# Patient Record
Sex: Male | Born: 1955 | Race: White | Hispanic: No | Marital: Married | Smoking: Former smoker
Health system: Southern US, Community
[De-identification: ages and names within clinical notes are randomized; demographics above are authoritative.]

## PROBLEM LIST (undated history)

## (undated) DIAGNOSIS — J984 Other disorders of lung: Secondary | ICD-10-CM

## (undated) DIAGNOSIS — I10 Essential (primary) hypertension: Secondary | ICD-10-CM

## (undated) DIAGNOSIS — G43909 Migraine, unspecified, not intractable, without status migrainosus: Secondary | ICD-10-CM

---

## 2014-04-20 ENCOUNTER — Ambulatory Visit
Admit: 2014-04-20 | Disposition: A | Discharge status: Home / Self Care | Payer: Self-pay | Attending: Family Medicine | Admitting: Family Medicine

## 2014-04-28 ENCOUNTER — Ambulatory Visit: Admit: 2014-04-28 | Disposition: A | Discharge status: Home / Self Care | Payer: Self-pay | Admitting: Neurology

## 2014-04-28 ENCOUNTER — Observation Stay
Admit: 2014-04-28 | Disposition: A | Discharge status: Home / Self Care | Payer: Self-pay | Attending: Internal Medicine | Admitting: Internal Medicine

## 2014-04-28 DIAGNOSIS — I351 Nonrheumatic aortic (valve) insufficiency: Secondary | ICD-10-CM | POA: Diagnosis not present

## 2014-04-28 LAB — POTASSIUM: Potassium: 2.8 mmol/L — ABNORMAL LOW

## 2014-04-28 LAB — CBC WITH DIFFERENTIAL/PLATELET
BASOS PCT: 0.9 %
Basophil #: 0.1 10*3/uL (ref 0.0–0.1)
EOS PCT: 2 %
Eosinophil #: 0.1 10*3/uL (ref 0.0–0.7)
HCT: 44.5 % (ref 40.0–52.0)
HGB: 15.6 g/dL (ref 13.0–18.0)
LYMPHS PCT: 34 %
Lymphocyte #: 2.3 10*3/uL (ref 1.0–3.6)
MCH: 32 pg (ref 26.0–34.0)
MCHC: 35 g/dL (ref 32.0–36.0)
MCV: 91 fL (ref 80–100)
Monocyte #: 0.4 x10 3/mm (ref 0.2–1.0)
Monocyte %: 6.6 %
Neutrophil #: 3.8 10*3/uL (ref 1.4–6.5)
Neutrophil %: 56.5 %
Platelet: 250 10*3/uL (ref 150–440)
RBC: 4.87 10*6/uL (ref 4.40–5.90)
RDW: 12.5 % (ref 11.5–14.5)
WBC: 6.8 10*3/uL (ref 3.8–10.6)

## 2014-04-28 LAB — PROTIME-INR
INR: 0.9
Prothrombin Time: 12.8 secs

## 2014-04-28 LAB — COMPREHENSIVE METABOLIC PANEL
ALBUMIN: 4 g/dL
ANION GAP: 11 (ref 7–16)
AST: 24 U/L
Alkaline Phosphatase: 57 U/L
BUN: 17 mg/dL
Bilirubin,Total: 0.4 mg/dL
CALCIUM: 9.1 mg/dL
CHLORIDE: 99 mmol/L — AB
Co2: 27 mmol/L
Creatinine: 1.02 mg/dL
EGFR (Non-African Amer.): >60
Glucose: 109 mg/dL — ABNORMAL HIGH
POTASSIUM: 2.7 mmol/L — AB
SGPT (ALT): 23 U/L
Sodium: 137 mmol/L
Total Protein: 6.9 g/dL

## 2014-04-28 LAB — TROPONIN I
Troponin-I: <0.03 ng/mL
Troponin-I: <0.03 ng/mL

## 2014-04-28 LAB — APTT: ACTIVATED PTT: 25.9 s (ref 23.6–35.9)

## 2014-04-28 LAB — MAGNESIUM: MAGNESIUM: 1.9 mg/dL

## 2014-04-29 ENCOUNTER — Ambulatory Visit: Admit: 2014-04-29 | Disposition: A | Discharge status: Home / Self Care | Payer: Self-pay | Admitting: Neurology

## 2014-04-29 LAB — CBC WITH DIFFERENTIAL/PLATELET
Basophil #: 0 10*3/uL (ref 0.0–0.1)
Basophil %: 0.2 %
EOS ABS: 0 10*3/uL (ref 0.0–0.7)
EOS PCT: 0 %
HCT: 42.1 % (ref 40.0–52.0)
HGB: 14.7 g/dL (ref 13.0–18.0)
LYMPHS PCT: 7.8 %
Lymphocyte #: 0.8 10*3/uL — ABNORMAL LOW (ref 1.0–3.6)
MCH: 32 pg (ref 26.0–34.0)
MCHC: 34.9 g/dL (ref 32.0–36.0)
MCV: 92 fL (ref 80–100)
MONO ABS: 0.6 x10 3/mm (ref 0.2–1.0)
Monocyte %: 5.6 %
Neutrophil #: 8.6 10*3/uL — ABNORMAL HIGH (ref 1.4–6.5)
Neutrophil %: 86.4 %
Platelet: 242 10*3/uL (ref 150–440)
RBC: 4.6 10*6/uL (ref 4.40–5.90)
RDW: 12.7 % (ref 11.5–14.5)
WBC: 10 10*3/uL (ref 3.8–10.6)

## 2014-04-29 LAB — BASIC METABOLIC PANEL
ANION GAP: 6 — AB (ref 7–16)
BUN: 17 mg/dL
CREATININE: 0.92 mg/dL
Calcium, Total: 8.6 mg/dL — ABNORMAL LOW
Chloride: 106 mmol/L
Co2: 24 mmol/L
EGFR (Non-African Amer.): >60
GLUCOSE: 130 mg/dL — AB
Potassium: 4 mmol/L
Sodium: 136 mmol/L

## 2014-04-29 LAB — HEMOGLOBIN A1C: Hemoglobin A1C: 5.2 %

## 2014-05-22 NOTE — H&P (Signed)
PATIENT NAME:  Terrance Willis, Terrance Willis MR#:  [id number] DATE OF BIRTH:  [birth year]  DATE OF ADMISSION:  04/28/2014  CHIEF COMPLAINT:  Numbness.   HISTORY OF PRESENT ILLNESS:  This is a 59 year old male who presents to the ED tonight with chief complaint of recurring intermittent numbness. He states that the numbness occurs in his left face and runs down his left arm typically. It has been occurring for the past a little more than a week and it has been coming and going. It is also associated at times with blurred vision and some dizziness, although not room spinning dizziness, but it is sort of a sensation of maybe ataxia, loss of balance, and some other smaller neurological findings, feeling of being "off" or "drunk" with some slowed speech and some report of some transient dysarthria with these episodes. He also states that after the episode lasts, which is usually about 3-1/2 hours, his lungs will feel like they are recovering from being "asleep" and often times the left arm will feel cold. Tonight, he states that he came in to the ED because the episode was actually a lot faster in onset and it did not just run down his left arm, the numbness ran all the way down his left leg, and he also had some chest pressure, cold sweats, and diaphoresis with it tonight. He has not been able to identify any aggravating or alleviating factors. He also states that of note last August, he did have an episode, not like these episodes, but he did have an episode of symptom where he went blind for a few seconds while he was driving. He did not pay much attention to it at that time. He thought it was related to the heat, but thinking back on it now, he is wondering if it is not related to what is going on at this current time. In the ED, CT of the head was negative. Workup was largely negative. The patient was recently admitted at another facility for similar symptoms and had an MRI done. We are requesting that MRI at this time. We have  contacted neurology from the ED and they recommended bringing him in for transient ischemic attack workup. The hospitalists were called for admission.   PRIMARY CARE PHYSICIAN:  Kris Mouton. Gerilyn Pilgrim, PA-C   PAST MEDICAL HISTORY:  Hypertension, GERD, asthma, ascending aortic aneurysm.   CURRENT MEDICATIONS:  ProAir 2 puffs q. 6 p.r.n., Nexium 40 mg daily, lisinopril 10 mg daily, Advair 250/50 one puff b.i.d.   PAST SURGICAL HISTORY: Lung surgery for removal of benign lesion left bicep repair and head fracture repair at some point in the past.   ALLERGIES:  No known drug allergies.   FAMILY HISTORY:  CAD, stroke, cancer, kidney disease, aortic aneurysm.   SOCIAL HISTORY:  Ex-smoker, quit 20 years ago. Drinks occasionally at social events, very occasionally per patient. Denies illicit drug use.   REVIEW OF SYSTEMS: CONSTITUTIONAL:  No fever, fatigue, or weakness.  EYES:  Some intermittent blurred vision. No pain or redness.  EARS, NOSE, AND THROAT:  No ear pain, hearing loss, or difficulty swallowing.  RESPIRATORY:  No cough, dyspnea, or painful respiration.  CARDIOVASCULAR:  He did have some chest pressure tonight. No edema or palpitations.  GASTROINTESTINAL:  No nausea, vomiting, diarrhea, abdominal pain, or constipation.  GENITOURINARY:  No dysuria, hematuria, or frequency.  ENDOCRINE:  No nocturia, thyroid problems, or heat or cold intolerance.  HEMATOLOGIC AND LYMPHATIC:  No easy bruising, bleeding, or swollen glands.  INTEGUMENTARY:  No acne, rash, or lesion.  MUSCULOSKELETAL:  No acute arthritis, joint swelling, or gout.  NEUROLOGICAL:  Endorses numbness as per HPI. No true overt weakness, maybe some symptoms of some intermittent dysarthria. He does get a headache after these episodes. No report of epilepsy or prior seizures.  PSYCHIATRIC:  No anxiety, insomnia, or depression.   PHYSICAL EXAMINATION: VITAL SIGNS:  Blood pressure 128/85, pulse 65, temperature 97.8, respirations 19 with  96% oxygen saturation on room air.  GENERAL:  This is a well-nourished gentleman lying in bed in no acute distress.  HEENT:  Pupils are equal, round, and reactive to light and accommodation. Extraocular movements are intact. No scleral icterus. Moist mucosal membranes.  NECK:  Thyroid is not enlarged. Neck is supple. No masses. Nontender. No cervical adenopathy. No JVD.  RESPIRATORY:  Clear to auscultation bilaterally. No rales, rhonchi, or wheezing. No respiratory distress.  CARDIOVASCULAR:  Regular rate and rhythm. No murmurs, rubs, or gallops on exam. Good pedal pulses with no lower extremity edema.  ABDOMEN:  Soft, nontender, nondistended. Good bowel sounds.  MUSCULOSKELETAL:  Muscular strength is 5/5 throughout all 4 extremities with full spontaneous range of motion. No cyanosis or clubbing.  SKIN:  No rash or lesion. Skin is warm, dry, and intact.  LYMPHATIC:  No adenopathy.  NEUROLOGIC:  Cranial nerves are intact. Sensation is intact throughout. Strength is equal throughout. No dysarthria currently. No aphasia. No contractures.  PSYCHIATRIC:  Alert and oriented x 3. The patient is cooperative, not confused or agitated.   LABORATORY DATA:  White count is 6.8, hemoglobin 15.6, hematocrit 44.5, and platelets are 250,000. Sodium is 137, potassium 2.7, chloride 99, CO2 of 27, BUN 17, creatinine 1.02, glucose 109, calcium 9.1, total protein 6.9, albumin 4.0, total bilirubin 0.4, alkaline phosphatase 57, AST 24, and ALT is 23. Troponin is less than 0.03. INR is 0.9. CT of the head tonight:  No acute intracranial abnormalities, mild cerebral atrophy.   ASSESSMENT AND PLAN: 1.  Transient ischemic attack. The patient's symptoms seem most consistent with possible transient ischemic attacks. Neurology recommended admission and workup for the same. We will consult neurology. We will put him on telemetry, cycle cardiac enzymes, and get an echocardiogram. Likely, neurology will recommend further testing. We  will hold on ordering MRI for now until we get results of the MRI that he just had recently.  2.  Hypokalemia, unclear etiology for this. We will replace this here.  3.  Hypertension. This problem is chronic and stable. We will continue his home medications at this time, as his blood pressure is our goal currently.  4.  Gastroesophageal reflux disease. The patient is on Nexium at home. We will use Protonix here to control his gastroesophageal reflux disease symptoms. 5.  Asthma, chronic stable problem. We will continue his home inhalers.  6.  Deep vein thrombosis prophylaxis. Subcutaneous Lovenox.  CODE STATUS:  This patient is full code.   TIME SPENT ON THIS ADMISSION:  50 minutes.    ____________________________ Candace Cruiseavid F. Anne HahnWillis, MD dfw:nb D: 04/28/2014 04:50:10 ET T: 04/28/2014 05:13:09 ET JOB#: [id number]  cc: Candace Cruiseavid F. Anne HahnWillis, MD, <Dictator> Keanu Frickey Scotty CourtF Bettie Swavely MD ELECTRONICALLY SIGNED 04/28/2014 6:52

## 2014-05-22 NOTE — Discharge Summary (Signed)
PATIENT NAME:  Terrance LeydenMURRAY, Antrell MR#:  [id number] DATE OF BIRTH:  1955-04-14  DATE OF ADMISSION:  04/28/2014 DATE OF DISCHARGE:  04/29/2014  ADMITTING DIAGNOSIS:  Transient ischemic attack.   DISCHARGE DIAGNOSES:  1.  Complicated migraine with left-sided sensory symptoms, resolved.  2.  Hypovolemia. Hypokalemia. 3.  Relative hypomagnesemia likely due to proton pump inhibitor.  4.  Hyperglycemia with hemoglobin A1c 5.2, no diabetes.  5.  History of gastroesophageal reflux disease without esophagitis.  6.  Mild persistent asthma without complications.  7.  Essential hypertension.  8.  Ascending aortic aneurysm, stable.   DISCHARGE CONDITION:  Stable.   DISCHARGE MEDICATIONS: The patient is to continue lisinopril 10 mg p.o. daily, Advair Diskus 250/50 one puff twice daily, Nexium 40 mg daily, ProAir HFA 2 puffs 4 times daily, amitriptyline 50 mg 1 tablet at bedtime, to decrease to 25 mg if not tolerated, promethazine 25 mg every 6 hours as needed, magnesium oxide 400 mg p.o. daily.    HOME OXYGEN:  None.    DIET: 2  gram salt, low fat, low cholesterol, regular consistency.   ACTIVITY LIMITATIONS: As tolerated.    FOLLOWUP APPOINTMENT: With PA, Mr. Gerilyn PilgrimSykes in 2 days after discharge. Also, [facility] in 1 week after discharge.   CONSULTANTS:  Care management.  Social work.  Pauletta BrownsYuriy Zeylikman, MD, neurologist    RADIOLOGIC STUDIES:  CT scan of the head without contrast revealing no acute intracranial abnormalities, mild cerebral atrophy. A CTA of carotid arteries 04/28/2014 revealing no extracranial flow reducing lesion or dissection identified. MRV of brain 04/28/2014 showing negative MRV of the intracranial circulation.   HOSPITAL COURSE: The patient  is a 59 year old Caucasian male with past medical history significant for history of asthma, gastroesophageal reflux disease, also hypertension who presented to the hospital with complaints of tingling and numbness of  left side of his body associated with severe and worsening headaches. Please refer to Dr. Anne HahnWillis admission note on 04/28/2014.   On arrival to the Emergency Room, the patient's temperature was 97.8, pulse 65, respiration was 19, blood pressure 128/85, saturation was 96% on oxygen. Physical exam was unremarkable. The patient's lab data done on arrival to the hospital showed elevated glucose level of 109, potassium 2.7, otherwise, BMP was unremarkable. The patient's hemoglobin A1c was 5.2. Magnesium 1.9. The patient's liver enzymes were normal. Cardiac enzymes x 2 normal. CBC was within normal limits with no left shift. Coagulation panel was unremarkable. EKG with premature atrial complexes, right bundle branch block, inferior infarct age indeterminate, and no acute ST, T changes were noted.   The patient was admitted to the hospital with a diagnosis of TIA and neurology consultation was obtained. Neurologist felt that patient very likely has complicated migraine. He recommended to stop migraine with Decadron, phenergan  as well as magnesium IV, which really worked well for patient.  He was able to rest. He was initiated  by neurologist on amitriptyline for migraine prevention. With this therapy his condition improved and he was able to feel much better, his headache resolved, and as well as his sensory symptoms. On the day of discharge 04/29/2014 patient feels satisfactory, does not complain of any significant weakness or numbness and he is felt to be stable to be discharged home. His vitals were stable with temperature of 97.8, pulse was 61, respiration rate was 19, blood pressure 129/82, saturation was 96% on room air at rest. The patient  was advised to continue amitriptyline and follow up with  neurology in the next few weeks after discharge for further recommendations. He also underwent MRV of his brain, which showed no abnormalities.   In regards to hypokalemia, the patient's potassium was supplemented as  well as his magnesium as mentioned above and his potassium normalized. It was 4.0 on 04/29/2014.  Because of hyperglycemia  patient had a hemoglobin A1c checked and was found to be normal, no diabetes. For his chronic medical problems, he is to continue his outpatient medications. He is being discharged in stable condition with the above-mentioned medications and followup.   TIME SPENT: 40 minutes.    ____________________________ Katharina Caper, MD rv:AT D: 04/29/2014 19:00:18 ET T: 04/30/2014 00:40:23 ET JOB#: [id number]  cc: Katharina Caper, MD, <Dictator> [facility]  Kris Mouton. Gerilyn Pilgrim, PA-C  Katharina Caper MD ELECTRONICALLY SIGNED 05/01/2014 16:48

## 2014-05-22 NOTE — Consult Note (Signed)
PATIENT NAME:  Terrance Willis, Terrance Willis MR#:  [id number] DATE OF BIRTH:  Jun 13, 1955  DATE OF CONSULTATION:  04/28/2014  REFERRING PHYSICIAN:   CONSULTING PHYSICIAN:  Pauletta BrownsYuriy Jorita Bohanon, MD  REASON FOR CONSULTATION: Headache and  left-sided numbness.   HISTORY OF PRESENT ILLNESS: A 59 year old gentleman presenting with intermittent numbness on the left side of the face and the left arm. The patient states symptoms have been going on for the past week. The patient also states he currently has been having increased headaches for the past week, pressure-like, associated with nausea, vomiting, photophobia, phonophobia, relieved with rest. Numbness self-resolved. The patient is status post [facility]. Imaging was done. No acute intracranial abnormality was found, just small vessel disease. The patient was admitted this morning with severe headache, status post Phenergan, Decadron and magnesium. Currently the headache has resolved and numbness has resolved. The patient is currently back to his baseline.   PAST MEDICAL HISTORY: Hypertension, GERD, asthma, ascending aortic aneurysm.   HOME MEDICATIONS: Have been reviewed.   PAST MEDICAL HISTORY: Hypertension, GERD, asthma, ascending aortic aneurysm described above.   PAST SURGICAL HISTORY: Lung cancer.   FAMILY HISTORY: Coronary artery disease, history of stroke, and cancer.   REVIEW OF SYSTEMS: No shortness of breath. No chest pain. No abdominal pain. Positive for fatigue, positive for headache. Left-sided numbness that has resolved. No anxiety, no insomnia, no depression.    NEUROLOGIC EXAMINATION: The patient is awake, alert and oriented to time, place, location and the reason why he is in the hospital. Facial sensation intact. Facial motor is intact. Tongue is midline. Uvula elevates symmetrically. Motor: 5/5 bilateral upper and lower extremities. Sensation: Intact to light touch and temperature. Coordination: Finger-to-nose intact. Gait: Not  assessed.   IMPRESSION: A 59 year old  gentleman with chronic history of headaches, worse for the past week, described as pressure-like, diffuse, throbbing, positive photophobia, positive phonophobia, worse than previous headaches. Headaches are associated with left-sided numbness. I suspect these are complicated migraines. I am not sure why these are new onset in this patient, specifically at the age of 59.   PLAN: The patient is status post Decadron, Phenergan and magnesium, currently back to his baseline. Numbness has improved. No headache. I started the patient on Elavil 25 mg nightly. At the same time, MR venogram of head ordered to make sure there is no sinus thrombosis that could be contributing to these new headaches. He is status post MRI of brain at an outside facility. Physical therapy and occupational therapy. If MRV is negative, I suspect the patient could be discharged tomorrow. The patient was also told, and please address it in the discharge instructions, besides the Elavil, the patient should take daily magnesium over-the-counter and vitamin B complex over-the-counter, as well, for headache prevention.   Thank you. It was a pleasure seeing this patient. Please call with any questions.    ____________________________ Pauletta BrownsYuriy Anthem Frazer, MD yz:JT D: 04/28/2014 13:14:47 ET T: 04/28/2014 14:11:40 ET JOB#: [id number]  cc: Pauletta BrownsYuriy Cheree Fowles, MD, <Dictator> Pauletta BrownsYURIY Maleki Hippe MD ELECTRONICALLY SIGNED 05/17/2014 21:25

## 2014-08-17 ENCOUNTER — Emergency Department: Payer: 59

## 2014-08-17 ENCOUNTER — Emergency Department
Admission: EM | Admit: 2014-08-17 | Discharge: 2014-08-17 | Disposition: A | Discharge status: Home / Self Care | Payer: 59 | Attending: Emergency Medicine | Admitting: Emergency Medicine

## 2014-08-17 DIAGNOSIS — R51 Headache: Secondary | ICD-10-CM | POA: Insufficient documentation

## 2014-08-17 DIAGNOSIS — K529 Noninfective gastroenteritis and colitis, unspecified: Secondary | ICD-10-CM | POA: Insufficient documentation

## 2014-08-17 DIAGNOSIS — K922 Gastrointestinal hemorrhage, unspecified: Secondary | ICD-10-CM | POA: Insufficient documentation

## 2014-08-17 DIAGNOSIS — Z87891 Personal history of nicotine dependence: Secondary | ICD-10-CM | POA: Insufficient documentation

## 2014-08-17 DIAGNOSIS — R109 Unspecified abdominal pain: Secondary | ICD-10-CM | POA: Diagnosis present

## 2014-08-17 HISTORY — DX: Other disorders of lung: J98.4

## 2014-08-17 HISTORY — DX: Migraine, unspecified, not intractable, without status migrainosus: G43.909

## 2014-08-17 HISTORY — DX: Essential (primary) hypertension: I10

## 2014-08-17 LAB — COMPREHENSIVE METABOLIC PANEL
ALT: 16 U/L — ABNORMAL LOW (ref 17–63)
AST: 19 U/L (ref 15–41)
Albumin: 4 g/dL (ref 3.5–5.0)
Alkaline Phosphatase: 56 U/L (ref 38–126)
Anion gap: 12 (ref 5–15)
BUN: 12 mg/dL (ref 6–20)
CO2: 27 mmol/L (ref 22–32)
Calcium: 9.5 mg/dL (ref 8.9–10.3)
Chloride: 97 mmol/L — ABNORMAL LOW (ref 101–111)
Creatinine, Ser: 1.18 mg/dL (ref 0.61–1.24)
GFR calc Af Amer: >60 mL/min (ref >60)
GLUCOSE: 108 mg/dL — AB (ref 65–99)
POTASSIUM: 2.9 mmol/L — AB (ref 3.5–5.1)
Sodium: 136 mmol/L (ref 135–145)
Total Bilirubin: 0.9 mg/dL (ref 0.3–1.2)
Total Protein: 7.7 g/dL (ref 6.5–8.1)

## 2014-08-17 LAB — CBC WITH DIFFERENTIAL/PLATELET
BASOS ABS: 0 10*3/uL (ref 0–0.1)
Basophils Relative: 0 %
EOS ABS: 0.2 10*3/uL (ref 0–0.7)
EOS PCT: 1 %
HCT: 44.2 % (ref 40.0–52.0)
HEMOGLOBIN: 15.3 g/dL (ref 13.0–18.0)
Lymphocytes Relative: 13 %
Lymphs Abs: 1.6 10*3/uL (ref 1.0–3.6)
MCH: 31.3 pg (ref 26.0–34.0)
MCHC: 34.7 g/dL (ref 32.0–36.0)
MCV: 90.2 fL (ref 80.0–100.0)
MONOS PCT: 6 %
Monocytes Absolute: 0.8 10*3/uL (ref 0.2–1.0)
NEUTROS ABS: 9.7 10*3/uL — AB (ref 1.4–6.5)
Neutrophils Relative %: 80 %
PLATELETS: 239 10*3/uL (ref 150–440)
RBC: 4.9 MIL/uL (ref 4.40–5.90)
RDW: 12.3 % (ref 11.5–14.5)
WBC: 12.3 10*3/uL — AB (ref 3.8–10.6)

## 2014-08-17 LAB — LIPASE, BLOOD: LIPASE: 14 U/L — AB (ref 22–51)

## 2014-08-17 LAB — TYPE AND SCREEN
ABO/RH(D): B NEG
Antibody Screen: NEGATIVE

## 2014-08-17 LAB — ABO/RH: ABO/RH(D): B NEG

## 2014-08-17 LAB — TROPONIN I: Troponin I: <0.03 ng/mL (ref <0.031)

## 2014-08-17 MED ORDER — IOHEXOL 350 MG/ML SOLN
100.0000 mL | Freq: Once | INTRAVENOUS | Status: AC | PRN
  Administered 2014-08-17: 100 mL via INTRAVENOUS

## 2014-08-17 MED ORDER — ONDANSETRON HCL 4 MG/2ML IJ SOLN
4.0000 mg | Freq: Once | INTRAMUSCULAR | Status: AC
  Administered 2014-08-17: 4 mg via INTRAVENOUS
  Filled 2014-08-17: qty 2

## 2014-08-17 MED ORDER — IOHEXOL 240 MG/ML SOLN
25.0000 mL | Freq: Once | INTRAMUSCULAR | Status: AC | PRN
  Administered 2014-08-17: 25 mL via ORAL

## 2014-08-17 MED ORDER — ONDANSETRON 4 MG PO TBDP: 4.0000 mg | ORAL_TABLET | Freq: Three times a day (TID) | ORAL | Status: AC | PRN

## 2014-08-17 MED ORDER — OXYCODONE-ACETAMINOPHEN 5-325 MG PO TABS: 1.0000 | ORAL_TABLET | Freq: Four times a day (QID) | ORAL | Status: DC | PRN

## 2014-08-17 MED ORDER — CIPROFLOXACIN HCL 500 MG PO TABS: 500.0000 mg | ORAL_TABLET | Freq: Two times a day (BID) | ORAL | Status: AC

## 2014-08-17 MED ORDER — MORPHINE SULFATE 4 MG/ML IJ SOLN
4.0000 mg | Freq: Once | INTRAMUSCULAR | Status: AC
  Administered 2014-08-17: 4 mg via INTRAVENOUS
  Filled 2014-08-17: qty 1

## 2014-08-17 MED ORDER — OXYCODONE-ACETAMINOPHEN 5-325 MG PO TABS
1.0000 | ORAL_TABLET | Freq: Once | ORAL | Status: AC
  Administered 2014-08-17: 1 via ORAL
  Filled 2014-08-17: qty 1

## 2014-08-17 MED ORDER — METRONIDAZOLE 500 MG PO TABS: 500.0000 mg | ORAL_TABLET | Freq: Three times a day (TID) | ORAL | Status: AC

## 2014-08-17 MED ORDER — METRONIDAZOLE 500 MG PO TABS
500.0000 mg | ORAL_TABLET | Freq: Once | ORAL | Status: AC
  Administered 2014-08-17: 500 mg via ORAL
  Filled 2014-08-17: qty 1

## 2014-08-17 MED ORDER — CIPROFLOXACIN HCL 500 MG PO TABS
500.0000 mg | ORAL_TABLET | Freq: Once | ORAL | Status: AC
  Administered 2014-08-17: 500 mg via ORAL
  Filled 2014-08-17: qty 1

## 2014-08-17 MED ORDER — SODIUM CHLORIDE 0.9 % IV BOLUS (SEPSIS)
1000.0000 mL | Freq: Once | INTRAVENOUS | Status: AC
  Administered 2014-08-17: 1000 mL via INTRAVENOUS

## 2014-08-17 NOTE — ED Notes (Addendum)
Pt states Monday he had a migraine with violent vomiting and felt like something tore in his abdominal and had some blood in emesis , had has bloody stools since Monday night ..states he was feeling better until today when he attempted to eat oatmeal and began having abdominal pain with periods of diaphoresis.Marland Kitchen

## 2014-08-17 NOTE — ED Notes (Signed)
Patient transported to CT 

## 2014-08-17 NOTE — ED Notes (Signed)
Patient assessment by Dr. Lenard Lance at same time as nursing assessment, no change in description of events from triage note.

## 2014-08-17 NOTE — Discharge Instructions (Signed)
Please take your entire course of antibiotics, as written. Please take your pain medication and nausea medication as needed, as prescribed. Follow-up with your primary care doctor in the next several days to discuss further management, and also to discuss repeat CT scan in 1 year to evaluate for pulmonary nodules. Return to the emergency department for any fever, acute worsening of abdominal pain, or nausea and vomiting if you are unable to keep down her antibiotics.    Colitis Colitis is inflammation of the colon. Colitis can be a short-term or long-standing (chronic) illness. Crohn's disease and ulcerative colitis are 2 types of colitis which are chronic. They usually require lifelong treatment. CAUSES  There are many different causes of colitis, including:  Viruses.  Germs (bacteria).  Medicine reactions. SYMPTOMS   Diarrhea.  Intestinal bleeding.  Pain.  Fever.  Throwing up (vomiting).  Tiredness (fatigue).  Weight loss.  Bowel blockage. DIAGNOSIS  The diagnosis of colitis is based on examination and stool or blood tests. X-rays, CT scan, and colonoscopy may also be needed. TREATMENT  Treatment may include:  Fluids given through the vein (intravenously).  Bowel rest (nothing to eat or drink for a period of time).  Medicine for pain and diarrhea.  Medicines (antibiotics) that kill germs.  Cortisone medicines.  Surgery. HOME CARE INSTRUCTIONS   Get plenty of rest.  Drink enough water and fluids to keep your urine clear or pale yellow.  Eat a well-balanced diet.  Call your caregiver for follow-up as recommended. SEEK IMMEDIATE MEDICAL CARE IF:   You develop chills.  You have an oral temperature above 102 F (38.9 C), not controlled by medicine.  You have extreme weakness, fainting, or dehydration.  You have repeated vomiting.  You develop severe belly (abdominal) pain or are passing bloody or tarry stools. MAKE SURE YOU:   Understand these  instructions.  Will watch your condition.  Will get help right away if you are not doing well or get worse. Document Released: 02/15/2004 Document Revised: 04/01/2011 Document Reviewed: 05/12/2009 [facility] Patient Information 2015 [place], [place]. This information is not intended to replace advice given to you by your health care provider. Make sure you discuss any questions you have with your health care provider.

## 2014-08-17 NOTE — ED Provider Notes (Signed)
[facility] Emergency Department Provider Note  Time seen: 9:47 AM  I have reviewed the triage vital signs and the nursing notes.   HISTORY  Chief Complaint Abdominal Pain    HPI Terrance Willis is a 59 y.o. male with a past medical history of migraines who presents the emergency department with left-sided abdominal pain, and bloody stool. According to the patient he developed a severe migraine on Monday. The patient states he had significant nausea and vomiting at that time. Starting Monday night he began with diarrhea which she states was bright red blood. He states he continued to have bright red blood on Tuesday. This morning he awoke with left-sided abdominal pain. He states he became nauseated and diaphoretic after eating oatmeal. This is the first thing the patient has eaten in 2 days. He is also now developing a migraine as well. Describes his abdominal pain is moderate, but currently a 3/10. Describes it as a dull aching pain. Describes his headache as moderate, mostly left-sided.     Past Medical History  Diagnosis Date  . Migraine     There are no active problems to display for this patient.   History reviewed. No pertinent past surgical history.  No current outpatient prescriptions on file.  Allergies Mushroom extract complex  No family history on file.  Social History History  Substance Use Topics  . Smoking status: Former Games developer  . Smokeless tobacco: Never Used  . Alcohol Use: Yes    Review of Systems Constitutional: Negative for fever. Cardiovascular: Negative for chest pain. Respiratory: Negative for shortness of breath. Gastrointestinal: Moderate left-sided abdominal pain. Positive for nausea and vomiting. Positive for bloody stool. Patient states last bloody stool was last night. Genitourinary: Negative for dysuria Neurological: Negative for headache 10-point ROS otherwise  negative.  ____________________________________________   PHYSICAL EXAM:  VITAL SIGNS: ED Triage Vitals  Enc Vitals Group     BP 08/17/14 0919 113/89 mmHg     Pulse Rate 08/17/14 0919 104     Resp 08/17/14 0919 16     Temp 08/17/14 0919 98 F (36.7 C)     Temp Source 08/17/14 0919 Oral     SpO2 08/17/14 0919 96 %     Weight 08/17/14 0919 185 lb (83.915 kg)     Height 08/17/14 0919 5\' 10"  (1.778 m)     Head Cir --      Peak Flow --      Pain Score 08/17/14 0920 1     Pain Loc --      Pain Edu? --      Excl. in GC? --     Constitutional: Alert and oriented. Well appearing and in no distress. Eyes: Normal exam ENT   Mouth/Throat: Mucous membranes are moist. Cardiovascular: Normal rate, regular rhythm. No murmurs, rubs, or gallops. Respiratory: Normal respiratory effort without tachypnea nor retractions. Breath sounds are clear and equal bilaterally. No wheezes/rales/rhonchi. Gastrointestinal: Moderate epigastric and left abdominal tenderness palpation. No rebound or guarding. No distention. Mild left CVA tenderness palpation. Rectal exam is nontender, gross blood present, guaiac positive. Musculoskeletal: Nontender with normal range of motion in all extremities.  Neurologic:  Normal speech and language. No gross focal neurologic deficits  Skin:  Skin is warm, dry and intact.  Psychiatric: Mood and affect are normal. Speech and behavior are normal.   ____________________________________________   RADIOLOGY  CT consistent with colitis.  ____________________________________________   INITIAL IMPRESSION / ASSESSMENT AND PLAN / ED COURSE  Pertinent  labs & imaging results that were available during my care of the patient were reviewed by me and considered in my medical decision making (see chart for details).  Patient with moderate left-sided abdominal tenderness palpation. Also with moderate epigastric tenderness palpation. Rectal exam shows one small hemorrhoid,  nontender, but grossly positive for blood which is strongly guaiac positive. We will proceed with a CT scan to help further evaluate the patient is abdominal pain. We will treat his pain, IV hydrate, and closely monitor in the emergency department. Given the patient's weakness with diaphoresis this morning with recent GI bleeding we will also send a type and screen and begin normal saline bolus.  CT scan consistent with colitis, white count of 12.5. We will treat the patient with Flagyl and ciprofloxacin as well as pain medication and nausea medication. Patient is agreeable to this plan. I also discussed the incidental findings of an appendicolith and pulmonary nodule with the patient, he will bring the CT report to his primary care doctor to arrange follow-up. Patient does have a smoking history but quit 20+ years ago.  ____________________________________________   FINAL CLINICAL IMPRESSION(S) / ED DIAGNOSES  Lower GI bleed Headache Abdominal pain Colitis   Minna Antis, MD 08/17/14 1342

## 2016-07-08 IMAGING — CR CERVICAL SPINE - COMPLETE 4+ VIEW
1 series · 6 of 6 positions shown · non-contrast
Comparison: None.

CLINICAL DATA: Fell down stairs last night

EXAM:
CERVICAL SPINE  4+ VIEWS

[Series 1: dxr cervical spine complete · 0.14mm/px · 6 of 6 slices shown]
[im 1/6]
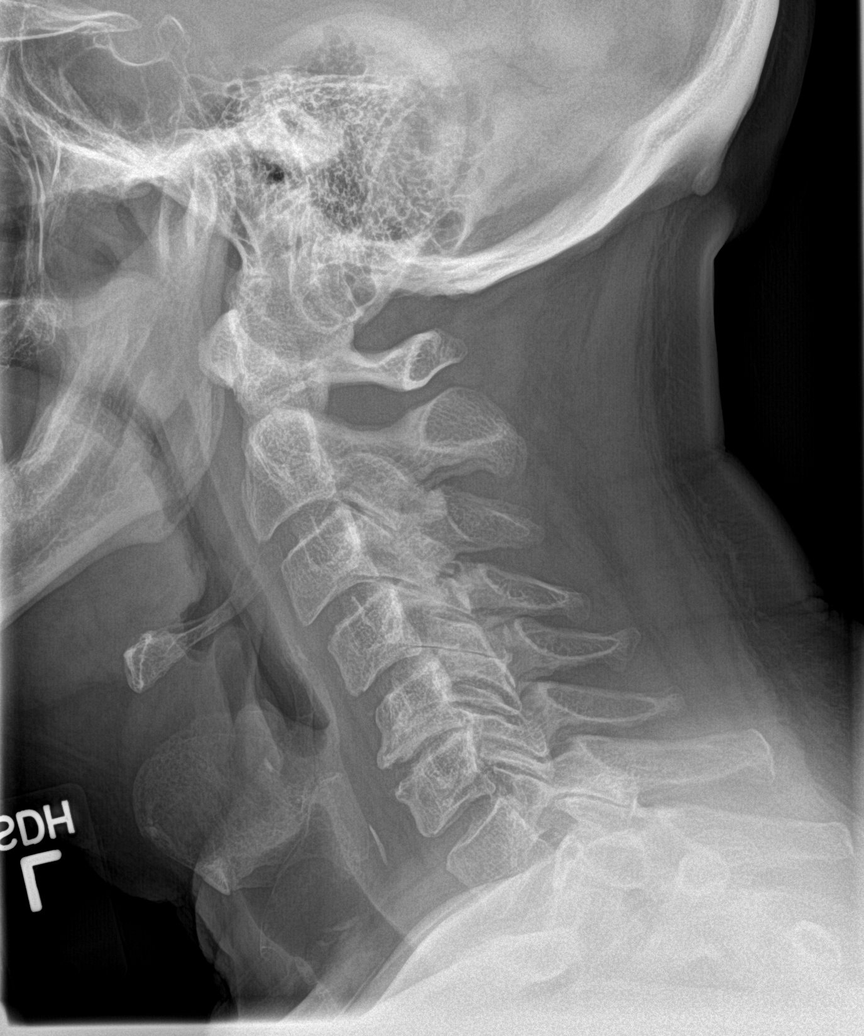
[im 2/6]
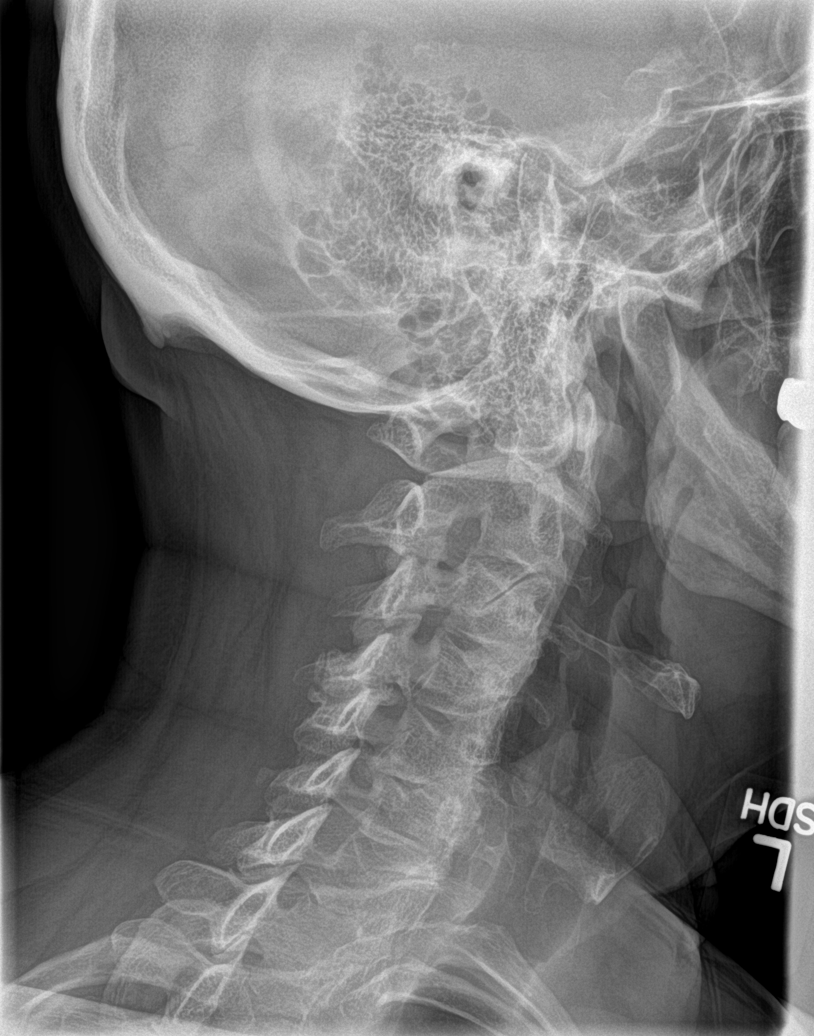
[im 3/6]
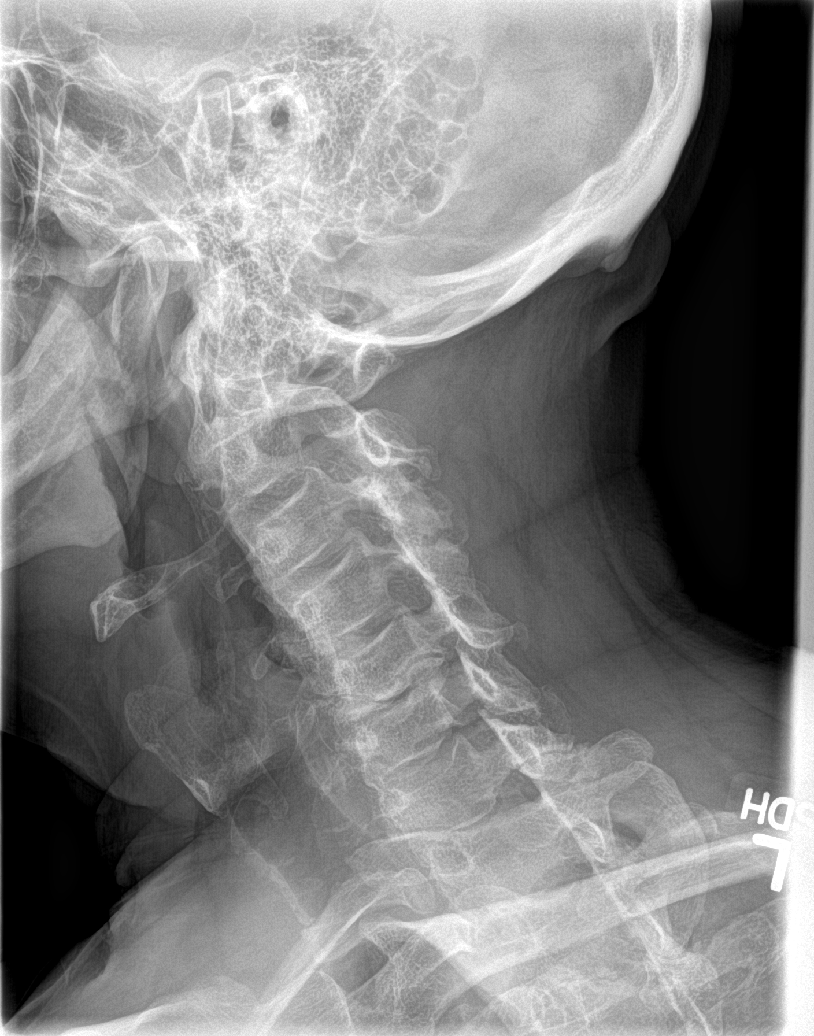
[im 4/6]
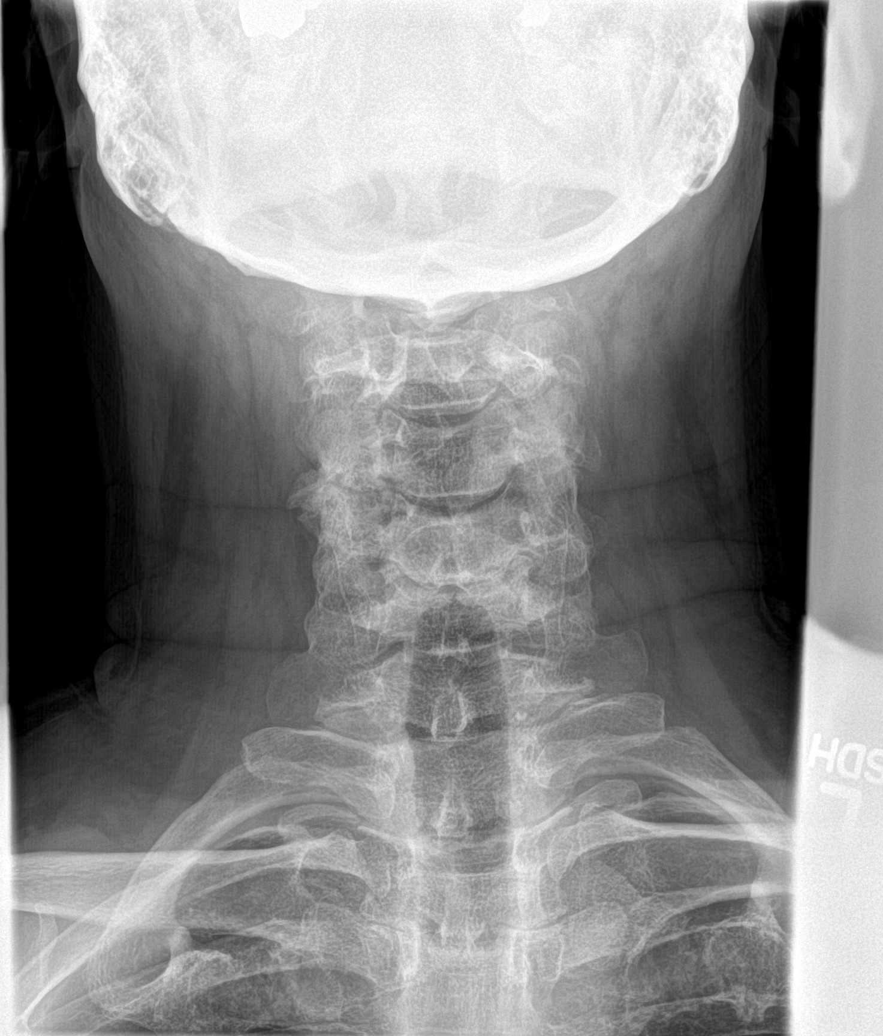
[im 5/6]
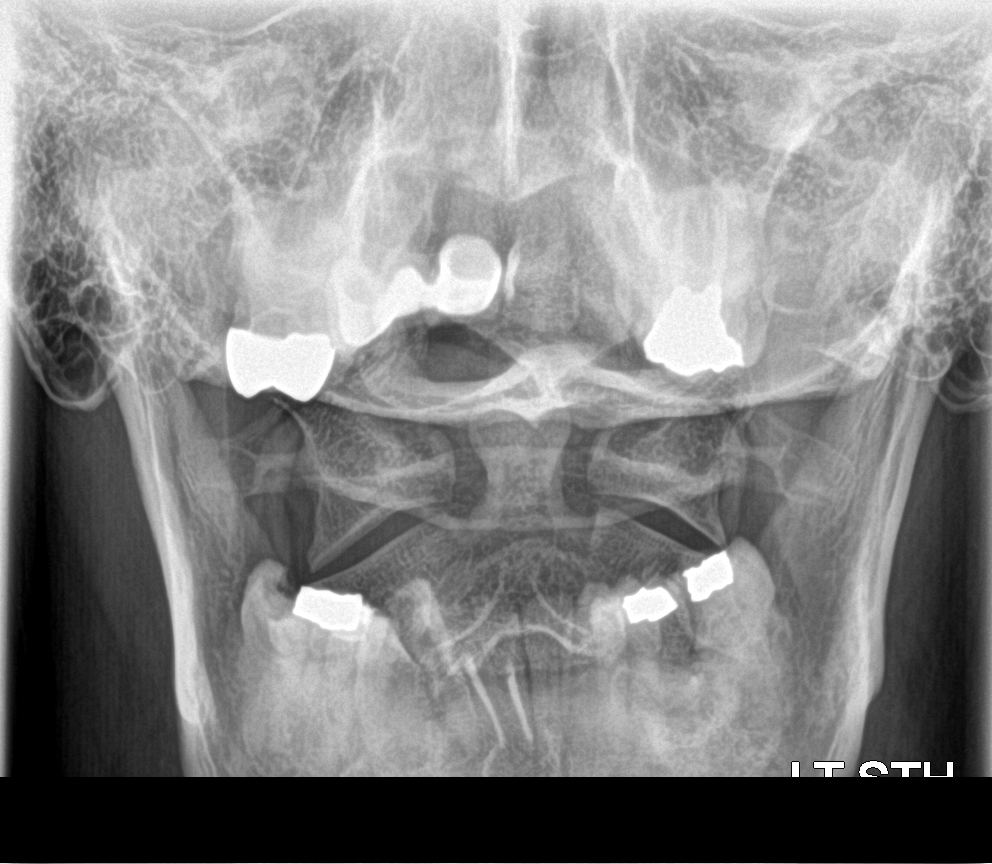
[im 6/6]
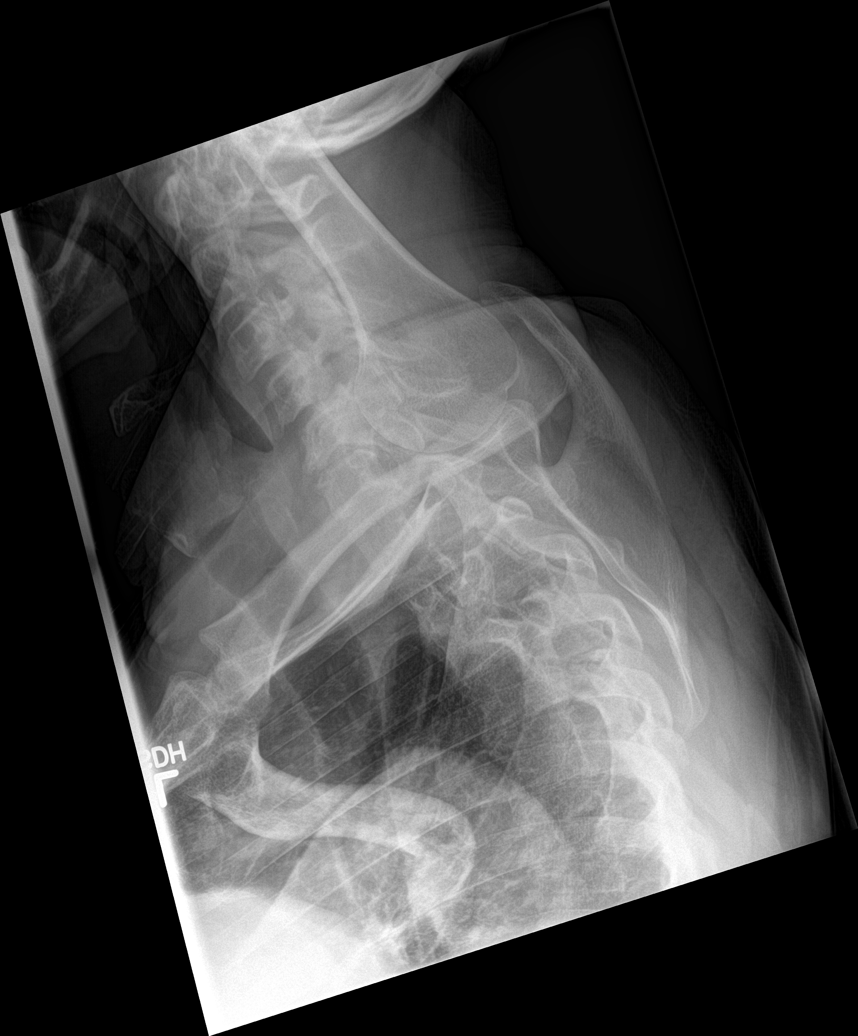

[6 of 6 positions shown; findings below may reference images not displayed]

FINDINGS: The cervical vertebrae are slightly straightened in alignment. There
is degenerative disc disease at C5-6 where there is loss of disc
space and sclerosis with spurring. The remainder of intervertebral
disc spaces appear normal. No prevertebral soft tissue swelling is
seen. On oblique views, there is moderate foraminal narrowing
bilaterally at C5-6. The odontoid process is intact. The lung apices
are clear.
IMPRESSION: Slightly straightened alignment with degenerative disc disease at
C5-6. Moderate bilateral foraminal narrowing at this level.

## 2020-04-18 ENCOUNTER — Encounter: Payer: Self-pay | Admitting: Neurology

## 2020-04-18 ENCOUNTER — Ambulatory Visit: Payer: Managed Care, Other (non HMO) | Admitting: Neurology

## 2020-04-18 VITALS — BP 134/82 | HR 60 | Ht 70.0 in | Wt 170.0 lb

## 2020-04-18 DIAGNOSIS — G2581 Restless legs syndrome: Secondary | ICD-10-CM | POA: Diagnosis not present

## 2020-04-18 NOTE — Progress Notes (Signed)
Subjective:    Patient ID: Terrance Willis is a 65 y.o. male.  HPI     Terrance Foley, MD, PhD [facility] [address], [address] [address] [place], [place] [address]  Dear Dr. Casper Harrison,   I saw your patient, Terrance Willis, upon your kind request, in my Neurologic clinic today for initial consultation of his restlessness.  The patient is unaccompanied today.  As you know, Terrance Willis is a 65 year old right-handed gentleman with an underlying medical history of hypertension, chronic lung disease, migraine headaches, aortic aneurysm, hyperlipidemia, reflux disease, osteoarthritis, and anxiety, who reports symptoms of restless leg syndrome.  He reports that he was recently diagnosed with fibromyalgia as well.  He reports that he has intermittent pain which affects different parts of his body.  When he is in more severe pain he has twitching in his fingertips and feet.  He has had some nighttime restlessness in his legs and movements at night in his sleep but these have improved since he has been on Lyrica.  His father had restless leg syndrome.  Patient reports no family history of Parkinson's disease.  He has an intermittent hand tremor.  He has had some numbness and tingling which is intermittent in different parts of his hands and feet.  He has not had any permanent numbness or weakness or one-sided symptoms such as sudden onset of one-sided weakness or numbness, droopy face or slurring of speech.  I reviewed the office note from 11/29/2019, as well as 02/14/2020.  He was taken off of Cymbalta for fibromyalgia and started on Lyrica at the time.  He reported pain and referral to a pain specialist was discussed.  He saw Maurie Boettcher, NP at the time. He had some blood work through your office a few months ago and was started on Lipitor.  Since he has been off of Cymbalta and amitriptyline and on the Lyrica, he reports feeling better.  He tries to hydrate with water.  He estimates  that he could do a little better with his water intake.  He limits his caffeine to 1 cup of coffee per day.  He drinks alcohol occasionally.  He quit smoking some 30 years ago.  His Past Medical History Is Significant For: Past Medical History:  Diagnosis Date  . Hypertension   . Migraine   . Restrictive airway disease 20 yrs ago    His Past Surgical History Is Significant For: History reviewed. No pertinent surgical history.  His Family History Is Significant For: History reviewed. No pertinent family history.  His Social History Is Significant For: Social History   Socioeconomic History  . Marital status: Married    Spouse name: Not on file  . Number of children: Not on file  . Years of education: Not on file  . Highest education level: Not on file  Occupational History  . Not on file  Tobacco Use  . Smoking status: Former Games developer  . Smokeless tobacco: Never Used  Substance and Sexual Activity  . Alcohol use: Yes  . Drug use: No  . Sexual activity: Yes  Other Topics Concern  . Not on file  Social History Narrative  . Not on file   Social Determinants of Health   Financial Resource Strain: Not on file  Food Insecurity: Not on file  Transportation Needs: Not on file  Physical Activity: Not on file  Stress: Not on file  Social Connections: Not on file    His Allergies Are:  Allergies  Allergen Reactions  . Mushroom Extract Complex Nausea And Vomiting  :   His Current Medications Are:  Outpatient Encounter Medications as of 04/18/2020  Medication Sig  . albuterol (PROVENTIL HFA;VENTOLIN HFA) 108 (90 BASE) MCG/ACT inhaler Inhale 1 puff into the lungs as needed for wheezing or shortness of breath.  Marland Kitchen aspirin-acetaminophen-caffeine (EXCEDRIN MIGRAINE) 250-250-65 MG tablet Take by mouth every 6 (six) hours as needed for headache.  Marland Kitchen atorvastatin (LIPITOR) 40 MG tablet Take 40 mg by mouth daily.  . B Complex Vitamins (VITAMIN B COMPLEX PO) Take by mouth.  .  co-enzyme Q-10 30 MG capsule Take 30 mg by mouth 3 (three) times daily.  Marland Kitchen esomeprazole (NEXIUM) 20 MG capsule Take 20 mg by mouth daily at 12 noon.  . Fluticasone-Salmeterol (ADVAIR) 250-50 MCG/DOSE AEPB Inhale 1 puff into the lungs 2 (two) times daily.  . Ginkgo Biloba (GINKOBA PO) Take by mouth.  . hydrochlorothiazide (HYDRODIURIL) 50 MG tablet Take 50 mg by mouth daily.  Marland Kitchen lisinopril (PRINIVIL,ZESTRIL) 20 MG tablet Take 20 mg by mouth daily.  . Magnesium 500 MG CAPS Take 250 mg by mouth daily.  . metoprolol succinate (TOPROL-XL) 25 MG 24 hr tablet Take 25 mg by mouth daily.  . pregabalin (LYRICA) 50 MG capsule Take 50 mg by mouth 2 (two) times daily as needed.  . TURMERIC PO Take by mouth.  Marland Kitchen VITAMIN D PO Take by mouth.  . ondansetron (ZOFRAN ODT) 4 MG disintegrating tablet Take 1 tablet (4 mg total) by mouth every 8 (eight) hours as needed for nausea or vomiting. (Patient not taking: Reported on 04/18/2020)  . [DISCONTINUED] amitriptyline (ELAVIL) 50 MG tablet Take 50 mg by mouth daily. (Patient not taking: Reported on 04/18/2020)  . [DISCONTINUED] oxyCODONE-acetaminophen (ROXICET) 5-325 MG per tablet Take 1 tablet by mouth every 6 (six) hours as needed.  . [DISCONTINUED] promethazine (PHENERGAN) 25 MG tablet Take 25 mg by mouth as needed.   No facility-administered encounter medications on file as of 04/18/2020.  :   Review of Systems:  Out of a complete 14 point review of systems, all are reviewed and negative with the exception of these symptoms as listed below:  Review of Systems  Neurological:       Here for consult on worsening numbness throughout the body. Hx of migraines as well, pt has light sensitivity. He does reports his PCP has mentioned that he might have fibromyalgia. Reports when his pain is at it's worst he will have twitching in his hands. He reports also he will not sleep for several days when pain severe.     Objective:  Neurological Exam  Physical Exam Physical  Examination:   Vitals:   04/18/20 1424  BP: 134/82  Pulse: 60    General Examination: The patient is a very pleasant 65 y.o. male in no acute distress. He appears well-developed and well-nourished and well groomed.   HEENT: Normocephalic, atraumatic, pupils are equal, round and reactive to light. Extraocular tracking is good without limitation to gaze excursion or nystagmus noted. Normal smooth pursuit is noted. Hearing is grossly intact.  Face is symmetric with normal facial animation and normal facial sensation.  Speech is clear with no dysarthria noted. There is no hypophonia. There is no lip, neck/head, jaw or voice tremor. Neck is supple with full range of passive and active motion. There are no carotid bruits on auscultation. Oropharynx exam reveals: mild mouth dryness, adequate dental hygiene.  Tongue protrudes centrally and palate elevates symmetrically.  Chest: Clear to auscultation without wheezing, rhonchi or crackles noted.  Heart: S1+S2+0, regular and normal without murmurs, rubs or gallops noted.   Abdomen: Soft, non-tender and non-distended with normal bowel sounds appreciated on auscultation.  Extremities: There is no pitting edema in the distal lower extremities bilaterally. Pedal pulses are intact.  Skin: Warm and dry without trophic changes noted.  Musculoskeletal: exam reveals no obvious joint deformities, tenderness or joint swelling or erythema.   Neurologically:  Mental status: The patient is awake, alert and oriented in all 4 spheres. His immediate and remote memory, attention, language skills and fund of knowledge are appropriate. There is no evidence of aphasia, agnosia, apraxia or anomia. Speech is clear with normal prosody and enunciation. Thought process is linear. Mood is normal and affect is normal.  Cranial nerves II - XII are as described above under HEENT exam. In addition: shoulder shrug is normal with equal shoulder height noted. Motor exam: Normal  bulk, strength and tone is noted. There is no drift, resting tremor or rebound.  Very slight postural tremor in both hands, intermittent.   Romberg is negative. Reflexes are 2+ throughout. Babinski: Toes are flexor bilaterally. Fine motor skills and coordination: intact with normal finger taps, normal hand movements, normal rapid alternating patting, normal foot taps and normal foot agility.  Cerebellar testing: No dysmetria or intention tremor on finger to nose testing. Heel to shin is unremarkable bilaterally. There is no truncal or gait ataxia.  Sensory exam: intact to light touch, pinprick, vibration, temperature sense with at times patchy decrease in pinprick and temperature sense, preserved vibration sense throughout, no sock-like or glovelike distribution, no dermatomal pattern.   Gait, station and balance: He stands easily. No veering to one side is noted. No leaning to one side is noted. Posture is age-appropriate and stance is narrow based. Gait shows normal stride length and normal pace. No problems turning are noted. Tandem walk is unremarkable.    Assessment and Plan:   In summary, Ketih Goodie is a very pleasant 65 y.o.-year old male with an underlying medical history of hypertension, chronic lung disease, migraine headaches, aortic aneurysm, hyperlipidemia, reflux disease, osteoarthritis, and anxiety, who presents for evaluation of his restlessness, he reports a history of restless leg syndrome.  Exam is benign.  He has no evidence of abnormal involuntary movements, has a slight hand postural tremor intermittently.  History and examination does not suggest any significant underlying movement disorder, symptoms of restless legs have improved since he started Lyrica.  He also reports chronic pain and a diagnosis of fibromyalgia.  His pain has improved with Lyrica as well.  He is largely reassured today.  No evidence of or telltale findings supportive of neuropathy.  He is advised to continue  follow-up through your office on a scheduled basis.  We will do some blood work today to look for treatable causes for restless leg syndrome including thyroid dysfunction, iron deficiency.  We will call him with his test results, so long as his results are benign, he can follow-up in this clinic on an as-needed basis.  I answered all his questions.  He was in agreement with the plan. Thank you very much for allowing me to participate in the care of this nice patient. If I can be of any further assistance to you please do not hesitate to call me at [phone].  Sincerely,   Terrance Foley, MD, PhD

## 2020-04-18 NOTE — Patient Instructions (Signed)
It was nice to meet you today.  I am glad to hear that your restless leg symptoms have improved. Your history and exam do not suggest any nerve damage or what we call neuropathy.  Certain conditions can exacerbate restless leg symptoms including certain antidepressant such as Cymbalta and amitriptyline.  You are currently not on any of these medications.  We will do some blood work for completion and call you with the results.  In particular, we will check for iron deficiency, anemia, thyroid dysfunction and vitamin B12 deficiency.  So long as your blood test results are benign we can see you in follow-up in this clinic on an as-needed basis, I do not see any signs of parkinsonism or underlying movement disorder.

## 2020-04-19 LAB — CBC WITH DIFFERENTIAL/PLATELET
Basophils Absolute: 0.1 10*3/uL (ref 0.0–0.2)
Basos: 2 %
EOS (ABSOLUTE): 0.3 10*3/uL (ref 0.0–0.4)
Eos: 5 %
Hematocrit: 43.9 % (ref 37.5–51.0)
Hemoglobin: 15 g/dL (ref 13.0–17.7)
Immature Grans (Abs): 0 10*3/uL (ref 0.0–0.1)
Immature Granulocytes: 0 %
Lymphocytes Absolute: 1.1 10*3/uL (ref 0.7–3.1)
Lymphs: 22 %
MCH: 33 pg (ref 26.6–33.0)
MCHC: 34.2 g/dL (ref 31.5–35.7)
MCV: 97 fL (ref 79–97)
Monocytes Absolute: 0.4 10*3/uL (ref 0.1–0.9)
Monocytes: 8 %
Neutrophils Absolute: 3.4 10*3/uL (ref 1.4–7.0)
Neutrophils: 63 %
Platelets: 262 10*3/uL (ref 150–450)
RBC: 4.55 x10E6/uL (ref 4.14–5.80)
RDW: 12.1 % (ref 11.6–15.4)
WBC: 5.3 10*3/uL (ref 3.4–10.8)

## 2020-04-19 LAB — IRON AND TIBC
Iron Saturation: 59 % — ABNORMAL HIGH (ref 15–55)
Iron: 166 ug/dL (ref 38–169)
Total Iron Binding Capacity: 280 ug/dL (ref 250–450)
UIBC: 114 ug/dL (ref 111–343)

## 2020-04-19 LAB — SEDIMENTATION RATE: Sed Rate: 2 mm/hr (ref 0–30)

## 2020-04-19 LAB — TSH: TSH: 0.583 u[IU]/mL (ref 0.450–4.500)

## 2020-04-19 LAB — VITAMIN D 25 HYDROXY (VIT D DEFICIENCY, FRACTURES): Vit D, 25-Hydroxy: 50.8 ng/mL (ref 30.0–100.0)

## 2020-04-19 LAB — HGB A1C W/O EAG: Hgb A1c MFr Bld: 5 % (ref 4.8–5.6)

## 2020-04-19 LAB — B12 AND FOLATE PANEL
Folate: 6.8 ng/mL (ref >3.0)
Vitamin B-12: 535 pg/mL (ref 232–1245)

## 2020-04-19 LAB — FERRITIN: Ferritin: 104 ng/mL (ref 30–400)

## 2020-04-19 LAB — ANA W/REFLEX: Anti Nuclear Antibody (ANA): NEGATIVE

## 2020-04-19 NOTE — Progress Notes (Signed)
Please call patient and advise him that his labs were benign.  As discussed, he can follow-up with his primary care.

## 2020-04-20 ENCOUNTER — Telehealth: Payer: Self-pay

## 2020-04-20 NOTE — Telephone Encounter (Signed)
Pt advised of results via vm. Advised to call back if he had any questions/concerns.

## 2020-04-20 NOTE — Telephone Encounter (Signed)
-----   Message from Huston Foley, MD sent at 04/19/2020  5:45 PM EDT ----- Please call patient and advise him that his labs were benign.  As discussed, he can follow-up with his primary care.

## 2023-07-15 ENCOUNTER — Ambulatory Visit

## 2023-07-15 VITALS — BP 106/52 | HR 61 | Ht 70.0 in | Wt 174.2 lb

## 2023-07-15 DIAGNOSIS — I7121 Aneurysm of the ascending aorta, without rupture: Secondary | ICD-10-CM | POA: Diagnosis not present

## 2023-07-15 DIAGNOSIS — I10 Essential (primary) hypertension: Secondary | ICD-10-CM | POA: Insufficient documentation

## 2023-07-15 DIAGNOSIS — E782 Mixed hyperlipidemia: Secondary | ICD-10-CM | POA: Diagnosis not present

## 2023-07-15 DIAGNOSIS — E785 Hyperlipidemia, unspecified: Secondary | ICD-10-CM | POA: Insufficient documentation

## 2023-07-15 NOTE — Assessment & Plan Note (Addendum)
 Abnormal lipid panel recently 06/11/2023 with total cholesterol 278, HDL 49, LDL 205, triglycerides 119.  Atorvastatin 40 mg once daily recently started by PCP. Continue the same. Recommended dietary and lifestyle modification.  Reassess lipid panel tentatively at 3 months, currently has been planned through his PCP. Will review fasting lipid panel at his subsequent follow-up visit in 4 months and titrate up medications as needed.  Target LDL levels below 100 mg/dL.

## 2023-07-15 NOTE — Patient Instructions (Signed)
 Medication Instructions:  Your physician recommends that you continue on your current medications as directed. Please refer to the Current Medication list given to you today.  *If you need a refill on your cardiac medications before your next appointment, please call your pharmacy*  Lab Work: None If you have labs (blood work) drawn today and your tests are completely normal, you will receive your results only by: MyChart Message (if you have MyChart) OR A paper copy in the mail If you have any lab test that is abnormal or we need to change your treatment, we will call you to review the results.  Testing/Procedures: Non-Cardiac CT Angiography (CTA), is a special type of CT scan that uses a computer to produce multi-dimensional views of major blood vessels throughout the body. In CT angiography, a contrast material is injected through an IV to help visualize the blood vessels   Follow-Up: At [facility], you and your health needs are our priority.  As part of our continuing mission to provide you with exceptional heart care, our providers are all part of one team.  This team includes your primary Cardiologist (physician) and Advanced Practice Providers or APPs (Physician Assistants and Nurse Practitioners) who all work together to provide you with the care you need, when you need it.  Your next appointment:   4 month(s)  Provider:   Alean Kobus, MD    We recommend signing up for the patient portal called MyChart.  Sign up information is provided on this After Visit Summary.  MyChart is used to connect with patients for Virtual Visits (Telemedicine).  Patients are able to view lab/test results, encounter notes, upcoming appointments, etc.  Non-urgent messages can be sent to your provider as well.   To learn more about what you can do with MyChart, go to [email or web address].   Other Instructions None

## 2023-07-15 NOTE — Assessment & Plan Note (Signed)
 Well-controlled. Target below 130/80 mmHg. Continue hydrochlorothiazide 50 mg once daily Lisinopril 20 mg once daily and metoprolol succinate 25 mg once daily.

## 2023-07-15 NOTE — Assessment & Plan Note (Signed)
 Reportedly with history of aortic aneurysm 4.5 cm size per MRI December 2019. Has not had follow-up imaging since then. No abdominal aortic aneurysm screening study, prior CT imaging from 2016 noted no abdominal aortic aneurysm.  He does have risk factors, smoking, family history.  Obtain a CTA chest and abdomen to assess aorta for aneurysm.  Advised about potential risk for aneurysmal enlargement and rupture in terms of age, size, exertional activities such as heavy weightlifting. Advised him to avoid heavy weightlifting and limit himself to cardio training.

## 2023-07-15 NOTE — Progress Notes (Signed)
 Cardiology Consultation:    Date:  07/15/2023   ID:  Terrance Willis, DOB 1955-09-09, MRN [id number]  PCP:  Terrance Lauraine DASEN, NP  Cardiologist:  Terrance SAUNDERS Margaux Engen, MD   Referring MD: Terrance Lauraine DASEN, NP   No chief complaint on file.    ASSESSMENT AND PLAN:   Terrance Willis 68 year old male history of hypertension, hyperlipidemia, COPD, ascending aorta aneurysm [prior MRI December 2019 4.5 cm;], former smoker [ smoked from age 29 until quitting at age 84], occasional alcohol use on social occasions. Here to establish care for hyperlipidemia management. Mentions he has not had follow-up with vascular surgeon or imaging for his aortic aneurysm in many years.  Problem List Items Addressed This Visit     Hypertension - Primary   Well-controlled. Target below 130/80 mmHg. Continue hydrochlorothiazide 50 mg once daily Lisinopril 20 mg once daily and metoprolol succinate 25 mg once daily.       Relevant Orders   CT ANGIO CHEST AORTA W/CM & OR WO/CM   Aneurysm of ascending aorta (HCC)   Reportedly with history of aortic aneurysm 4.5 cm size per MRI December 2019. Has not had follow-up imaging since then. No abdominal aortic aneurysm screening study, prior CT imaging from 2016 noted no abdominal aortic aneurysm.  He does have risk factors, smoking, family history.  Obtain a CTA chest and abdomen to assess aorta for aneurysm.  Advised about potential risk for aneurysmal enlargement and rupture in terms of age, size, exertional activities such as heavy weightlifting. Advised him to avoid heavy weightlifting and limit himself to cardio training.        Relevant Orders   CT ANGIO CHEST AORTA W/CM & OR WO/CM   Hyperlipidemia   Abnormal lipid panel recently 06/11/2023 with total cholesterol 278, HDL 49, LDL 205, triglycerides 119.  Atorvastatin 40 mg once daily recently started by PCP. Continue the same. Recommended dietary and lifestyle modification.  Reassess lipid panel  tentatively at 3 months, currently has been planned through his PCP. Will review fasting lipid panel at his subsequent follow-up visit in 4 months and titrate up medications as needed.  Target LDL levels below 100 mg/dL.       Relevant Orders   CT ANGIO CHEST AORTA W/CM & OR WO/CM   Return to clinic tentatively in 4 months.   History of Present Illness:    Terrance Willis is a 68 y.o. male who is being seen today for the evaluation of hyperlipidemia at the request of Terrance Lauraine DASEN, NP.  Has history of hypertension, hyperlipidemia, COPD, ascending aorta aneurysm [prior MRI December 2019 4.5 cm;], former smoker [ smoked from age 6 until quitting at age 84], occasional alcohol use on social occasions.  Pleasant man here for the visit by himself.  Lives with his wife at home.  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  Denies any active cardiac symptoms at this time.  Mentions he has been actively practicing tai chi recently From a cholesterol standpoint blood cholesterol levels being elevated he was recently started on atorvastatin 40 mg by his PCP a month ago and has been tolerating it well.  For blood pressure control has been on hydrochlorothiazide 50 mg once daily, lisinopril 20 mg once daily and metoprolol succinate 25 mg once daily for a long time and has been tolerating it well.  Denies any other significant cardiac symptoms such as chest  pain, orthopnea, paroxysmal nocturnal dyspnea, pedal edema.  No syncopal or near syncopal episodes.  Family history significant for mother with aneurysmal rupture  Blood work from 06-11-2023 with total cholesterol 278, HDL 49, LDL 205, triglycerides 119. BUN 18, creatinine 1.2, EGFR greater than 60. Sodium 138, potassium 3.7. Normal transaminases and alkaline phosphatase. TSH 0.95, normal. Hemoglobin 15.5, hematocrit 42, WBC 5.9 and platelets 279.    Past Medical  History:  Diagnosis Date   Hypertension    Migraine    Restrictive airway disease 20 yrs ago    History reviewed. No pertinent surgical history.  Current Medications: Current Meds  Medication Sig   albuterol (PROVENTIL HFA;VENTOLIN HFA) 108 (90 BASE) MCG/ACT inhaler Inhale 1 puff into the lungs as needed for wheezing or shortness of breath.   aspirin-acetaminophen -caffeine (EXCEDRIN MIGRAINE) 250-250-65 MG tablet Take by mouth every 6 (six) hours as needed for headache.   atorvastatin (LIPITOR) 40 MG tablet Take 40 mg by mouth daily.   B Complex Vitamins (VITAMIN B COMPLEX PO) Take by mouth.   co-enzyme Q-10 30 MG capsule Take 30 mg by mouth 3 (three) times daily.   Ginkgo Biloba (GINKOBA PO) Take by mouth.   hydrochlorothiazide (HYDRODIURIL) 50 MG tablet Take 50 mg by mouth daily.   lisinopril (PRINIVIL,ZESTRIL) 20 MG tablet Take 20 mg by mouth daily.   Magnesium 500 MG CAPS Take 250 mg by mouth daily.   metoprolol succinate (TOPROL-XL) 25 MG 24 hr tablet Take 25 mg by mouth daily.   ondansetron  (ZOFRAN  ODT) 4 MG disintegrating tablet Take 1 tablet (4 mg total) by mouth every 8 (eight) hours as needed for nausea or vomiting.   traZODone (DESYREL) 50 MG tablet Take 100 mg by mouth at bedtime as needed.   TURMERIC PO Take by mouth.   VITAMIN D  PO Take by mouth.     Allergies:   Mushroom extract complex (obsolete)   Social History   Socioeconomic History   Marital status: Married    Spouse name: Not on file   Number of children: Not on file   Years of education: Not on file   Highest education level: Not on file  Occupational History   Not on file  Tobacco Use   Smoking status: Former   Smokeless tobacco: Never  Substance and Sexual Activity   Alcohol use: Yes   Drug use: No   Sexual activity: Yes  Other Topics Concern   Not on file  Social History Narrative   Not on file   Social Drivers of Health   Financial Resource Strain: Not on file  Food Insecurity: Not on  file  Transportation Needs: Not on file  Physical Activity: Not on file  Stress: Not on file  Social Connections: Not on file     Family History: The patient's family history is not on file. ROS:   Please see the history of present illness.    All 14 point review of systems negative except as described per history of present illness.  EKGs/Labs/Other Studies Reviewed:    The following studies were reviewed today:   EKG:       Recent Labs: No results found for requested labs within last 365 days.  Recent Lipid Panel No results found for: CHOL, TRIG, HDL, CHOLHDL, VLDL, LDLCALC, LDLDIRECT  Physical Exam:    VS:  BP (!) 106/52   Pulse 61   Ht 5' 10 (1.778 m)   Wt 174 lb 3.2 oz (79 kg)   SpO2  96%   BMI 25.00 kg/m     Wt Readings from Last 3 Encounters:  07/15/23 174 lb 3.2 oz (79 kg)  04/18/20 170 lb (77.1 kg)  08/17/14 185 lb (83.9 kg)     GENERAL:  Well nourished, well developed in no acute distress NECK: No JVD; No carotid bruits CARDIAC: RRR, S1 and S2 present, no murmurs, no rubs, no gallops CHEST:  Clear to auscultation without rales, wheezing or rhonchi  Extremities: No pitting pedal edema. Pulses bilaterally symmetric with radial 2+ and dorsalis pedis 2+ NEUROLOGIC:  Alert and oriented x 3  Medication Adjustments/Labs and Tests Ordered: Current medicines are reviewed at length with the patient today.  Concerns regarding medicines are outlined above.  Orders Placed This Encounter  Procedures   CT ANGIO CHEST AORTA W/CM & OR WO/CM   No orders of the defined types were placed in this encounter.   Signed, Terrance jess Kobus, MD, MPH, [facility]. 07/15/2023 11:39 AM    [facility]

## 2023-07-29 ENCOUNTER — Ambulatory Visit: Payer: Self-pay

## 2023-07-29 ENCOUNTER — Ambulatory Visit (INDEPENDENT_AMBULATORY_CARE_PROVIDER_SITE_OTHER)
Admission: RE | Admit: 2023-07-29 | Discharge: 2023-07-29 | Disposition: A | Discharge status: Home / Self Care | Source: Ambulatory Visit

## 2023-07-29 DIAGNOSIS — I7121 Aneurysm of the ascending aorta, without rupture: Secondary | ICD-10-CM | POA: Diagnosis not present

## 2023-07-29 DIAGNOSIS — I10 Essential (primary) hypertension: Secondary | ICD-10-CM

## 2023-07-29 DIAGNOSIS — E782 Mixed hyperlipidemia: Secondary | ICD-10-CM

## 2023-07-29 MED ORDER — IOHEXOL 350 MG/ML SOLN
100.0000 mL | Freq: Once | INTRAVENOUS | Status: AC | PRN
  Administered 2023-07-29: 75 mL via INTRAVENOUS

## 2023-07-29 NOTE — Progress Notes (Signed)
 Please inform him the test results show stable size of the ascending aorta aneurysm. May consider follow-up visit in 1 year. There is mild amount of plaque buildup noted associated with the blood vessels that supply the heart and the aorta. Lung findings consistent with his underlying history of COPD but observed. Incidental finding of gallbladder stones noted. Please also forward the results to his PCP to make a note of gallbladder stones and further evaluation if needed depending on symptoms.  Thank you.

## 2023-10-08 NOTE — Progress Notes (Signed)
 Results routed to PCP. Recall placed for f/u in 1 year (July 2026).
